# Patient Record
Sex: Male | Born: 1980 | Hispanic: Yes | Marital: Single
Health system: Southern US, Community
[De-identification: ages and names within clinical notes are randomized; demographics above are authoritative.]

---

## 2013-07-28 ENCOUNTER — Other Ambulatory Visit: Payer: Self-pay | Admitting: Gastroenterology

## 2013-07-28 DIAGNOSIS — R1013 Epigastric pain: Secondary | ICD-10-CM

## 2013-08-03 ENCOUNTER — Ambulatory Visit
Admission: RE | Admit: 2013-08-03 | Discharge: 2013-08-03 | Disposition: A | Discharge status: Home / Self Care | Payer: Commercial Managed Care - PPO | Source: Ambulatory Visit | Attending: Gastroenterology | Admitting: Gastroenterology

## 2013-08-03 DIAGNOSIS — R1013 Epigastric pain: Secondary | ICD-10-CM

## 2015-02-14 IMAGING — US US ABDOMEN COMPLETE
1 series · 14 of 25 positions shown · non-contrast
Comparison: None.

CLINICAL DATA: Abdominal pain and bloating

EXAM:
ULTRASOUND ABDOMEN COMPLETE

[Series 1: us abdomen complete · 0.45mm/px · 14 of 75 slices shown]
[im 1/75]
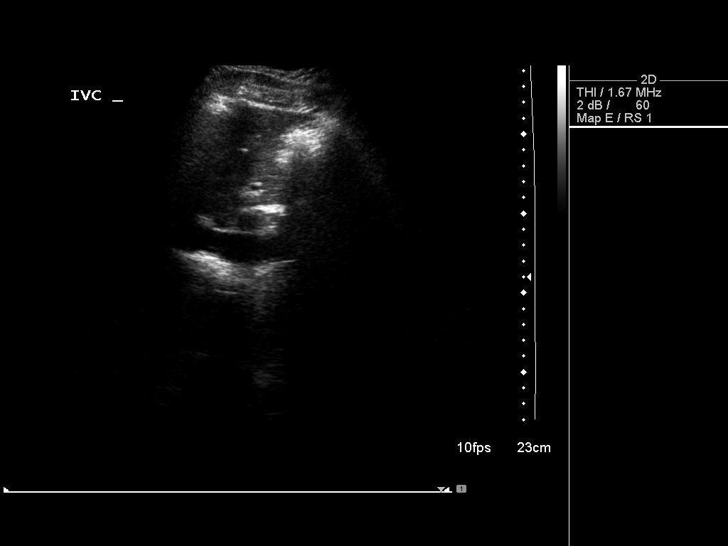
[im 7/75]
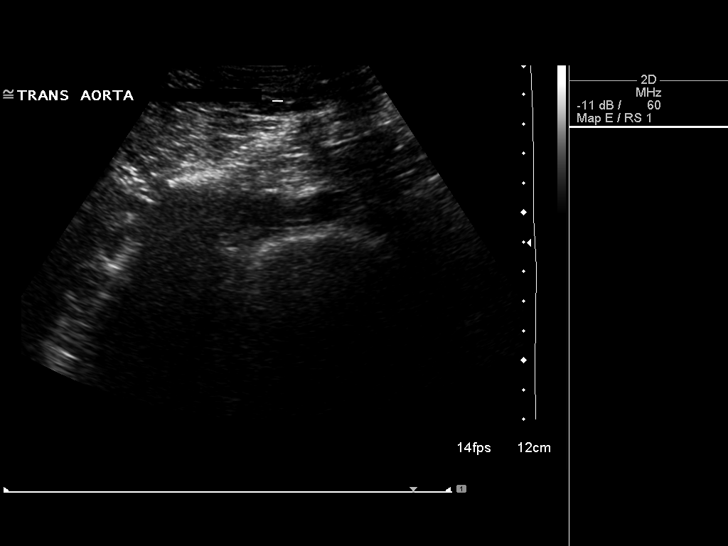
[im 13/75]
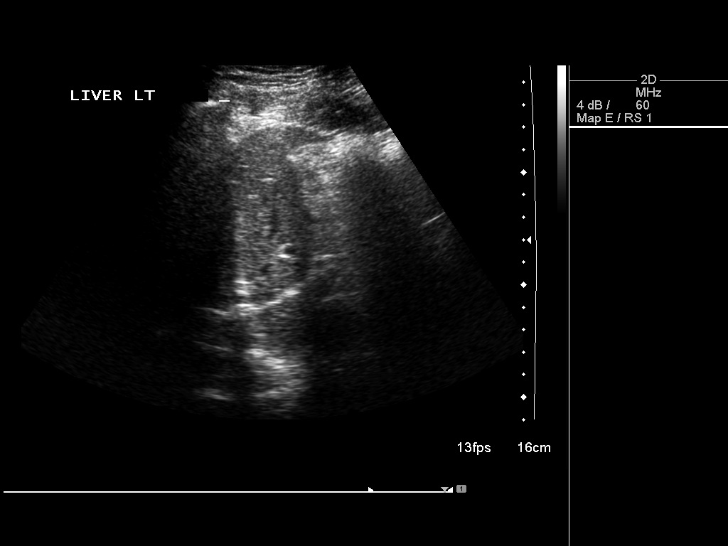
[im 19/75]
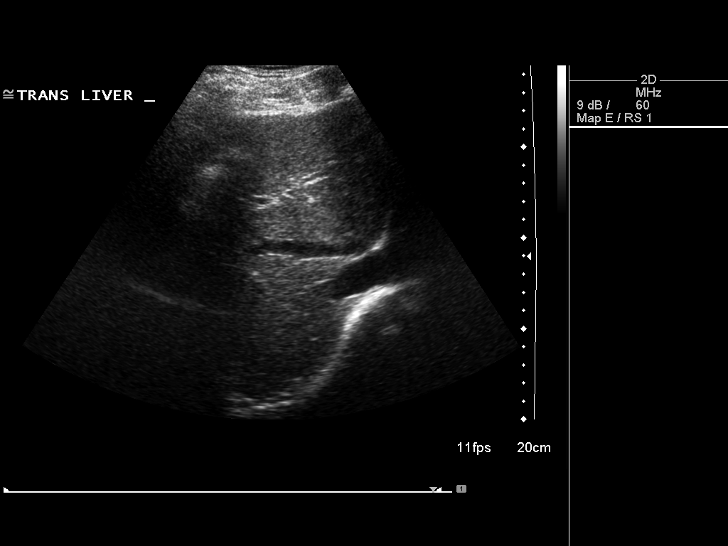
[im 25/75]
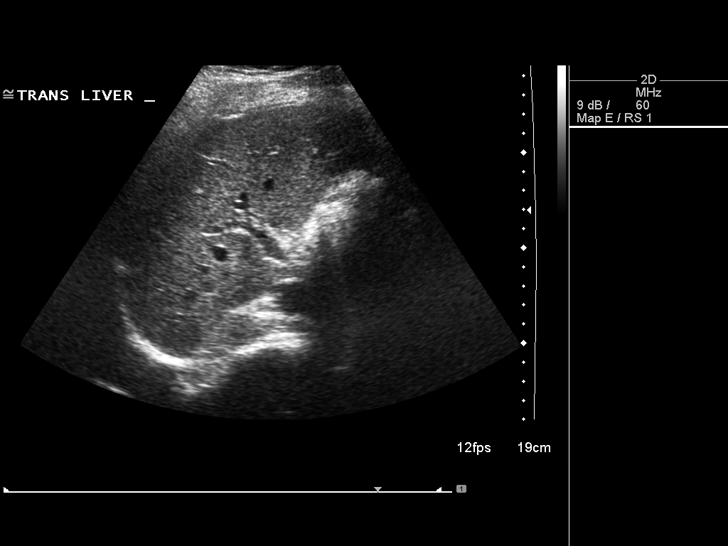
[im 28/75]
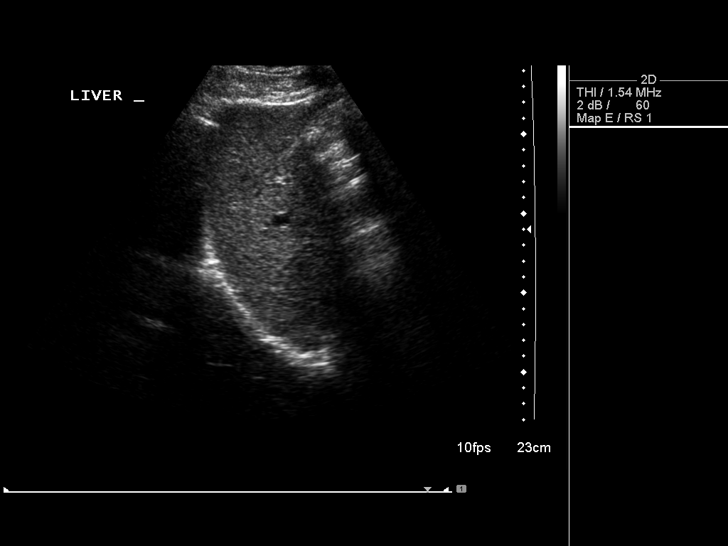
[im 34/75]
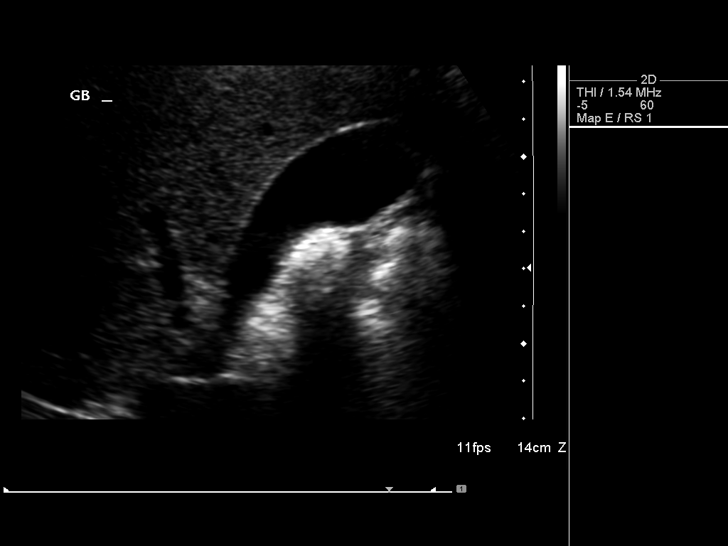
[im 41/75]
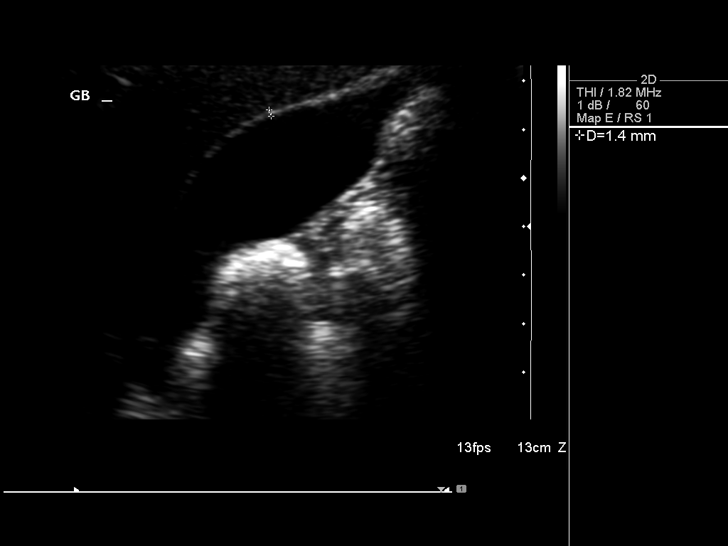
[im 47/75]
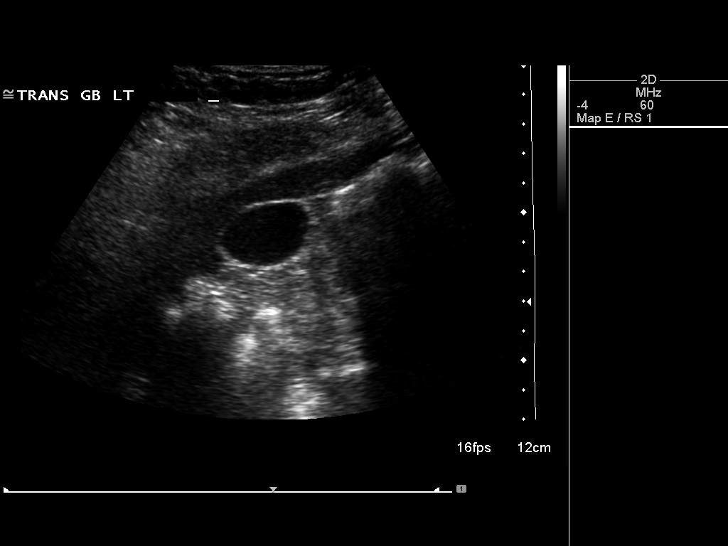
[im 50/75]
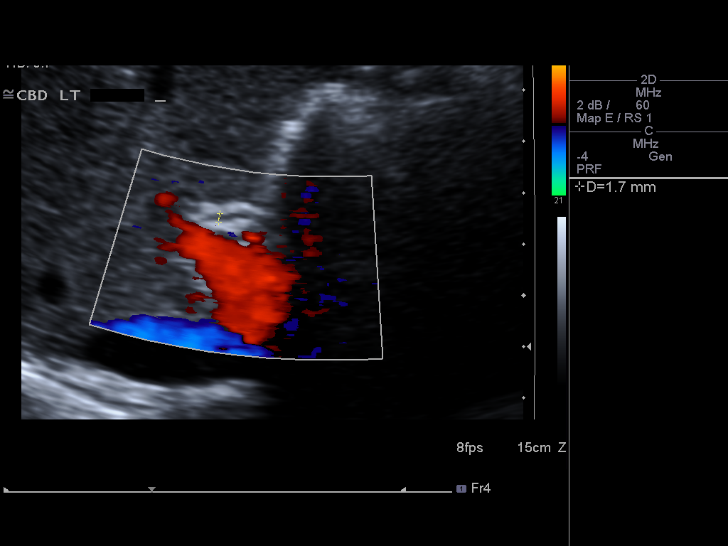
[im 56/75]
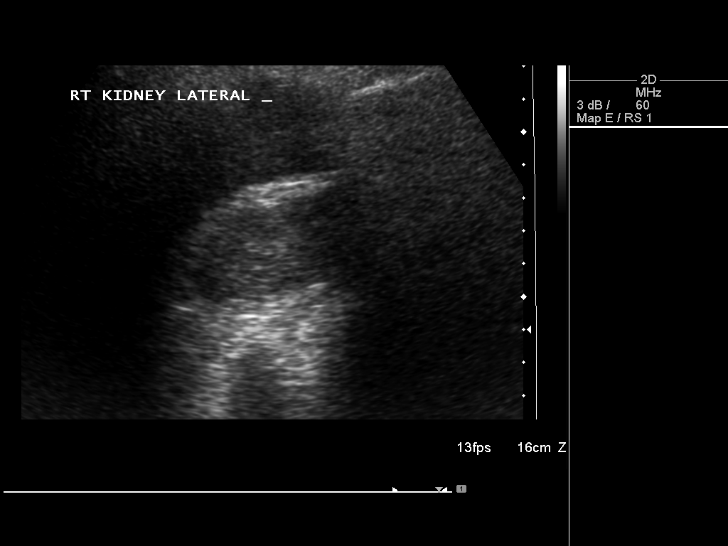
[im 62/75]
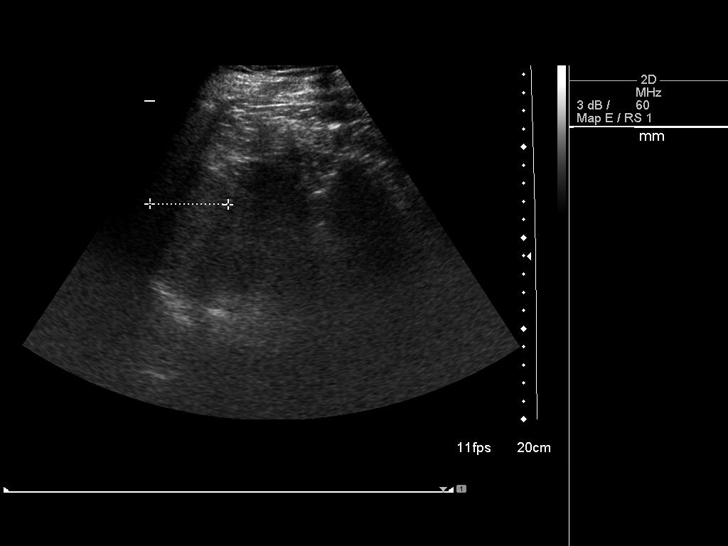
[im 68/75]
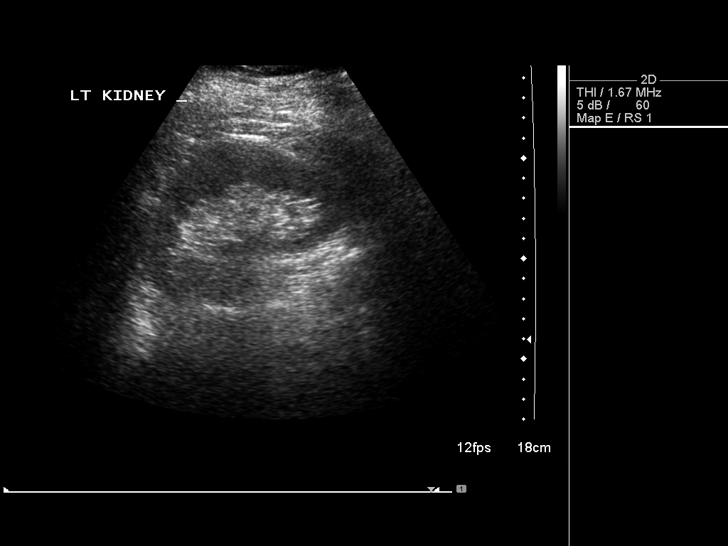
[im 75/75]
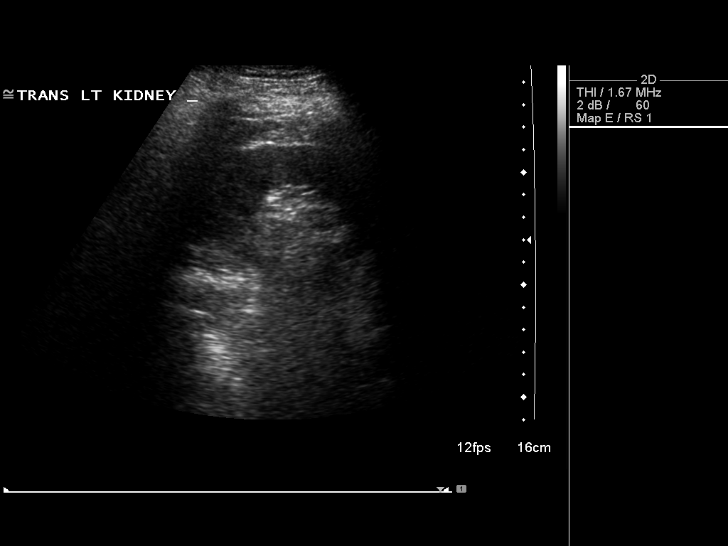

[14 of 25 positions shown; findings below may reference images not displayed]

FINDINGS: Gallbladder:

No gallstones or wall thickening visualized. There is no
pericholecystic fluid. No sonographic Murphy sign noted.

Common bile duct:

Diameter: 2 mm. There is no intrahepatic, common hepatic, or common
bile duct dilatation.

Liver:

No focal lesion identified. Within normal limits in parenchymal
echogenicity.

IVC:

No abnormality visualized.

Pancreas:

Visualized portion unremarkable. Portions of pancreas are obscured
by gas.

Spleen:

Size and appearance within normal limits.

Right Kidney:

Length: 10.9 cm. Echogenicity within normal limits. No mass or
hydronephrosis visualized.

Left Kidney:

Length: 10.6 cm. Echogenicity within normal limits. No mass or
hydronephrosis visualized.

Abdominal aorta:

No aneurysm visualized.

Other findings:

No demonstrable ascites.
IMPRESSION: Portions of pancreas are obscured by gas. Visualized portions of
pancreas appear normal. Study otherwise unremarkable.
